# Patient Record
Sex: Female | Born: 1975
Health system: Southern US, Community
[De-identification: ages and names within clinical notes are randomized; demographics above are authoritative.]

## PROBLEM LIST (undated history)

## (undated) HISTORY — PX: INTRAUTERINE DEVICE (IUD) INSERTION: SHX5877

## (undated) HISTORY — PX: TONSILLECTOMY AND ADENOIDECTOMY: SHX28

---

## 2003-09-14 ENCOUNTER — Other Ambulatory Visit: Admission: RE | Admit: 2003-09-14 | Discharge: 2003-09-14 | Payer: Self-pay | Admitting: Obstetrics and Gynecology

## 2004-01-05 ENCOUNTER — Other Ambulatory Visit: Admission: RE | Admit: 2004-01-05 | Discharge: 2004-01-05 | Payer: Self-pay | Admitting: Obstetrics and Gynecology

## 2005-07-02 ENCOUNTER — Inpatient Hospital Stay (HOSPITAL_COMMUNITY): Admission: AD | Admit: 2005-07-02 | Discharge: 2005-07-02 | Payer: Self-pay | Admitting: Obstetrics and Gynecology

## 2006-02-02 ENCOUNTER — Inpatient Hospital Stay (HOSPITAL_COMMUNITY): Admission: AD | Admit: 2006-02-02 | Discharge: 2006-02-05 | Payer: Self-pay | Admitting: Obstetrics & Gynecology

## 2006-02-13 ENCOUNTER — Ambulatory Visit: Admission: RE | Admit: 2006-02-13 | Discharge: 2006-02-13 | Payer: Self-pay | Admitting: Obstetrics and Gynecology

## 2009-07-09 ENCOUNTER — Encounter (INDEPENDENT_AMBULATORY_CARE_PROVIDER_SITE_OTHER): Payer: Self-pay | Admitting: Obstetrics

## 2009-07-09 ENCOUNTER — Inpatient Hospital Stay (HOSPITAL_COMMUNITY): Admission: AD | Admit: 2009-07-09 | Discharge: 2009-07-09 | Payer: Self-pay | Admitting: Obstetrics

## 2010-09-17 ENCOUNTER — Other Ambulatory Visit: Payer: Self-pay | Admitting: Obstetrics & Gynecology

## 2010-09-18 ENCOUNTER — Inpatient Hospital Stay (HOSPITAL_COMMUNITY): Admission: AD | Admit: 2010-09-18 | Discharge: 2010-09-22 | Payer: Self-pay | Admitting: Obstetrics & Gynecology

## 2011-01-22 LAB — CBC
HCT: 33.8 % — ABNORMAL LOW (ref 36.0–46.0)
HCT: 37.9 % (ref 36.0–46.0)
Hemoglobin: 10.5 g/dL — ABNORMAL LOW (ref 12.0–15.0)
Hemoglobin: 11.6 g/dL — ABNORMAL LOW (ref 12.0–15.0)
MCH: 34.1 pg — ABNORMAL HIGH (ref 26.0–34.0)
MCHC: 34.3 g/dL (ref 30.0–36.0)
MCV: 100.4 fL — ABNORMAL HIGH (ref 78.0–100.0)
MCV: 99.4 fL (ref 78.0–100.0)
Platelets: 195 10*3/uL (ref 150–400)
Platelets: 215 10*3/uL (ref 150–400)
RBC: 3.36 MIL/uL — ABNORMAL LOW (ref 3.87–5.11)
RBC: 3.75 MIL/uL — ABNORMAL LOW (ref 3.87–5.11)
RBC: 3.81 MIL/uL — ABNORMAL LOW (ref 3.87–5.11)
RDW: 14.4 % (ref 11.5–15.5)
RDW: 14.5 % (ref 11.5–15.5)
RDW: 14.8 % (ref 11.5–15.5)
WBC: 12.7 10*3/uL — ABNORMAL HIGH (ref 4.0–10.5)

## 2011-01-22 LAB — SURGICAL PCR SCREEN: MRSA, PCR: NEGATIVE

## 2011-03-29 NOTE — Consult Note (Signed)
Katelyn [name], Katelyn [name]              ACCOUNT NO.:  [id number]   MEDICAL RECORD NO.:  [id number]          PATIENT TYPE:  MAT   LOCATION:  [facility]                          FACILITY:  [facility]   PHYSICIAN:  Lenoard Aden, M.D.DATE OF BIRTH:  11-22-1975   DATE OF CONSULTATION:  DATE OF DISCHARGE:                                   CONSULTATION   CONSULTING PHYSICIAN:  Lenoard Aden, M.D.   CHIEF COMPLAINT:  First trimester bleeding.   HISTORY OF PRESENT ILLNESS:  The patient is a 35 year old white female, G2,  P0, at 8 weeks' gestation, with bright red blood per vagina tonight.  She  has a history of spontaneous pregnancy loss x1.   MEDICATIONS:  Prenatal vitamins.   No known drug allergies.   Pregnancy history otherwise noncontributory.   SOCIAL HISTORY:  Noncontributory.   FAMILY HISTORY:  Noncontributory.   PHYSICAL EXAMINATION:  GENERAL:  She is a well-developed, well-nourished,  white female in no acute distress.  HEENT:  Normal.  LUNGS:  Clear.  HEART:  Regular rhythm.  ABDOMEN:  Soft, nontender.  PELVIC:  Reveals an 8-week size uterus.  No adnexal masses.  Minimal  bleeding noted.  EXTREMITIES:  Reveal no cords.  NEUROLOGIC:  Nonfocal.   Ultrasound reveals a 7.5-week viable intrauterine pregnancy.  Fetal heart  tones noted.  Small hemorrhagic clot noted over the cervix.   IMPRESSION:  Threatened abortion with viable intrauterine pregnancy.   PLAN:  1.  Reassurance given.  2.  Discharge home.  3.  Bleeding precautions given.  4.  Follow up in the office in one week.      Lenoard Aden, M.D.  Electronically Signed     RJT/MEDQ  D:  07/02/2005  T:  07/02/2005  Job:  [id number]   cc:   Ma Hillock

## 2011-03-29 NOTE — Discharge Summary (Signed)
[name], [name]              ACCOUNT NO.:  [id number]   MEDICAL RECORD NO.:  [id number]          PATIENT TYPE:  INP   LOCATION:  9105                          FACILITY:  [facility]   PHYSICIAN:  Genia Del, M.D.DATE OF BIRTH:  [birth year]   DATE OF ADMISSION:  02/02/2006  DATE OF DISCHARGE:  02/05/2006                                 DISCHARGE SUMMARY   ADMISSION DIAGNOSES:  1.  Thirty-eight-weeks and 6 days' gestation.  2.  Spontaneous labor.  3.  Breech presentation.   DISCHARGE DIAGNOSES:  1.  Thirty-eight-weeks and 6 days' gestation.  2.  Spontaneous labor.  3.  Breech presentation.  4.  Birth of a baby boy by cesarean section.   PROCEDURE:  Urgent low transverse primary cesarean section on February 02, 2006.   HOSPITAL COURSE:  The patient had an uneventful C-section on February 02, 2006.  She had a baby boy with Apgar scores at 8 and 9.  Estimated blood loss was  600 mL.  Her hemoglobin on postop day 1 was 12.4.  She was discharged on  postop day 3 in stable status.  Postop advise was given.  The patient was  discharged home.   FOLLOWUP:  She will follow up in 4 weeks at [facility].   DISCHARGE MEDICATIONS:  She was prescribed Vicodin p.r.n.      Genia Del, M.D.  Electronically Signed     ML/MEDQ  D:  03/02/2006  T:  03/04/2006  Job:  [id number]

## 2011-03-29 NOTE — Op Note (Signed)
Katelyn [name], Katelyn [name]              ACCOUNT NO.:  [id number]   MEDICAL RECORD NO.:  [id number]          PATIENT TYPE:  INP   LOCATION:  9105                          [facility]ILITY:  [facility]   PHYSICIAN:  Genia Del, M.D.DATE OF BIRTH:  20-Dec-1975   DATE OF PROCEDURE:  DATE OF DISCHARGE:                                 OPERATIVE REPORT   PREOPERATIVE DIAGNOSES:  1.  Thirty-eight weeks 6 days' gestation.  2.  Breech presentation with spontaneous labor.   POSTOPERATIVE DIAGNOSES:  1.  Thirty-eight weeks 6 days' gestation.  2.  Breech presentation with spontaneous labor.   INTERVENTION:  Urgent low transverse primary cesarean section.   SURGEON:  Genia Del, M.D.  No assistant.   ANESTHESIOLOGIST:  Quillian Quince, M.D.   PROCEDURE:  Under spinal anesthesia, the patient is in 15 degree lateral  decubitus position.  She is prepped with Betadine on the abdominal,  suprapubic, vulvar and vaginal areas and draped as usual.  The bladder is  catheterized.  A Pfannenstiel incision is made with a scalpel.  The adipose  tissue is opened with a scalpel and electrocautery.  We open the aponeurosis  transversely with Mayo scissors.  The recti abdominal muscles are separated  from the aponeurosis on the midline.  We open the parietal peritoneum  longitudinally with Metzenbaum scissors.  The bladder retractor is put in  place and the visceral peritoneum over the lower uterine segment is opened  transversely with Metzenbaum scissors.  The bladder is reclined downward.  We then proceed with a low transverse hysterotomy with a scalpel, extension  on each side with dressing scissors.  The amniotic fluid is clear.  The  fetus is in frank breech presentation.  Birth of a baby boy at 2225.  The  baby is suctioned, the cord is clamped and cut.  The Apgars are 8 and 9.  The placenta is evacuated spontaneously.  It is complete with three vessels.  The intrauterine revision is done.  The uterus  appears normal in appearance.  The two ovaries and the two tubes are normal in appearance as well.  The  uterus contracts well.  Pitocin IV was started.  A dose of Ancef 1 g IV was  given.  The hysterotomy was closed in one full locked running suture of 0  Vicryl.  A second plane in a mattress fashion is done with 0 Vicryl.  Hemostasis is completed in the midline of the incision with two X stitches  of 0 Vicryl.  Hemostasis is then adequate.  We irrigate and suction the  abdominal-pelvic cavity.  We then complete hemostasis on the recti muscles  and the adipose tissue with the electrocautery.  We close the aponeurosis  with two half running sutures of 0 Vicryl.  The skin is reapproximated with  staples and infiltration of Marcaine 0.25% 10 mL is done at the subcutaneous  tissue  along the incision.  A dry dressing is applied.  The count of instruments  and sponges was complete x2.  The estimated blood loss was 600 mL.  No  complication occurred, and the patient  was transferred to the recovery room  in good status.      Genia Del, M.D.  Electronically Signed     ML/MEDQ  D:  02/02/2006  T:  02/04/2006  Job:  [id number]

## 2011-06-06 ENCOUNTER — Other Ambulatory Visit: Payer: Self-pay | Admitting: Obstetrics & Gynecology

## 2011-06-06 DIAGNOSIS — N644 Mastodynia: Secondary | ICD-10-CM

## 2011-06-07 ENCOUNTER — Ambulatory Visit
Admission: RE | Admit: 2011-06-07 | Discharge: 2011-06-07 | Disposition: A | Discharge status: Home / Self Care | Payer: BC Managed Care – PPO | Source: Ambulatory Visit | Attending: Obstetrics & Gynecology | Admitting: Obstetrics & Gynecology

## 2011-06-07 DIAGNOSIS — N644 Mastodynia: Secondary | ICD-10-CM

## 2011-12-23 ENCOUNTER — Other Ambulatory Visit: Payer: Self-pay | Admitting: Obstetrics & Gynecology

## 2011-12-23 DIAGNOSIS — N611 Abscess of the breast and nipple: Secondary | ICD-10-CM

## 2011-12-24 ENCOUNTER — Ambulatory Visit
Admission: RE | Admit: 2011-12-24 | Discharge: 2011-12-24 | Disposition: A | Discharge status: Home / Self Care | Payer: BC Managed Care – PPO | Source: Ambulatory Visit | Attending: Obstetrics & Gynecology | Admitting: Obstetrics & Gynecology

## 2011-12-24 DIAGNOSIS — N611 Abscess of the breast and nipple: Secondary | ICD-10-CM

## 2014-09-15 ENCOUNTER — Encounter: Payer: Self-pay | Admitting: Podiatry

## 2014-09-15 ENCOUNTER — Ambulatory Visit (INDEPENDENT_AMBULATORY_CARE_PROVIDER_SITE_OTHER): Payer: BC Managed Care – PPO | Admitting: Podiatry

## 2014-09-15 ENCOUNTER — Ambulatory Visit (INDEPENDENT_AMBULATORY_CARE_PROVIDER_SITE_OTHER): Payer: BC Managed Care – PPO

## 2014-09-15 VITALS — BP 92/58 | HR 72 | Resp 16

## 2014-09-15 DIAGNOSIS — M2010 Hallux valgus (acquired), unspecified foot: Secondary | ICD-10-CM

## 2014-09-15 DIAGNOSIS — M79673 Pain in unspecified foot: Secondary | ICD-10-CM

## 2014-09-15 DIAGNOSIS — M779 Enthesopathy, unspecified: Secondary | ICD-10-CM

## 2014-09-15 DIAGNOSIS — M7750 Other enthesopathy of unspecified foot: Secondary | ICD-10-CM

## 2014-09-15 NOTE — Progress Notes (Signed)
Subjective:     Patient ID: Katelyn [name], female   DOB: [birth year], 38 y.o.   MRN: [id number]  HPIpatient states I have had problems with the bunions on both my feet becoming painful and I've tried to change shoe gear and pad without relief and also I do get pain in my feet in general if I do a lot of walking   Review of Systems  All other systems reviewed and are negative.      Objective:   Physical Exam  Constitutional: She is oriented to person, place, and time.  Cardiovascular: Intact distal pulses.   Musculoskeletal: Normal range of motion.  Neurological: She is oriented to person, place, and time.  Skin: Skin is warm.  Nursing note and vitals reviewed. neurovascular status intact with muscle strength adequate and range of motion of the subtalar and midtarsal joint within normal limits. Patient is noted to have mild hyperostosis medial aspect first metatarsal head left and right that are painful when pressed with redness around the first metatarsal head and is also noted to have moderate depression of the arch with inflammation noted. Digits are noted to be well perfused patient well oriented 3 and has no equinus condition     Assessment:     Structural HAV deformity bilateral along with mild tendinitis-like symptoms secondary to foot structure with significant family history of structural bunion deformity    Plan:     H&P and x-rays reviewed with patient. Today we discussed correction of deformity due to her long-standing nature and failure to respond to conservative care and she would like to have surgery done but will most likely wait until February as she is going to [employer] the end of January. She will call with date and we will have her back prior to go over everything in detail and today I did go ahead and I scanned for custom orthotics to reduce plantar stress on her feet

## 2014-09-15 NOTE — Patient Instructions (Signed)

## 2014-09-15 NOTE — Progress Notes (Signed)
   Subjective:    Patient ID: Katelyn [name], female    DOB: 05/21/1976, 38 y.o.   MRN: [id number]  HPI Comments: "I have these knots on the side"  Patient c/o aching 1st MPJ bilateral for about 1 year. The area is very painful with shoes and walking a lot. No home treatment.  Foot Pain Associated symptoms include arthralgias.      Review of Systems  Musculoskeletal: Positive for arthralgias.  All other systems reviewed and are negative.      Objective:   Physical Exam        Assessment & Plan:

## 2014-10-14 ENCOUNTER — Ambulatory Visit: Payer: BC Managed Care – PPO

## 2014-10-14 DIAGNOSIS — M779 Enthesopathy, unspecified: Secondary | ICD-10-CM

## 2014-10-14 NOTE — Progress Notes (Signed)
PT is here to PUO

## 2014-10-14 NOTE — Patient Instructions (Signed)

## 2017-05-08 DIAGNOSIS — D225 Melanocytic nevi of trunk: Secondary | ICD-10-CM | POA: Diagnosis not present

## 2017-05-08 DIAGNOSIS — D1801 Hemangioma of skin and subcutaneous tissue: Secondary | ICD-10-CM | POA: Diagnosis not present

## 2017-05-08 DIAGNOSIS — L814 Other melanin hyperpigmentation: Secondary | ICD-10-CM | POA: Diagnosis not present

## 2018-06-29 ENCOUNTER — Ambulatory Visit (INDEPENDENT_AMBULATORY_CARE_PROVIDER_SITE_OTHER): Payer: 59 | Admitting: Obstetrics & Gynecology

## 2018-06-29 ENCOUNTER — Encounter: Payer: Self-pay | Admitting: Obstetrics & Gynecology

## 2018-06-29 VITALS — BP 118/76

## 2018-06-29 DIAGNOSIS — Z30431 Encounter for routine checking of intrauterine contraceptive device: Secondary | ICD-10-CM

## 2018-06-29 DIAGNOSIS — Z01419 Encounter for gynecological examination (general) (routine) without abnormal findings: Secondary | ICD-10-CM

## 2018-06-29 NOTE — Progress Notes (Signed)
Katelyn [name] Oct 06, 1976 [id number]   History:    42 y.o. [id number] Married.  Son is in 7th grade, daughter in 2nd grade.  RP:  Established patient presenting for annual gyn exam   HPI: Well on Mirena IUD x 11/2015.  Rare light periods.  No pelvic pain.  No pain with IC.  Urine/BMs wnl.  Breasts wnl.  Normal weight/height.  Good nutrition.  Active physically.  Will schedule Health labs with Fam MD.  Past medical history,surgical history, family history and social history were all reviewed and documented in the EPIC chart.  Gynecologic History No LMP recorded. (Menstrual status: IUD). Contraception: Mirena IUD x 11/2015 Last Pap: 2016. Results were: normal Last mammogram: 11/2015. Results were: Negative. Bone Density: Never Colonoscopy: Never  Obstetric History OB History  Gravida Para Term Preterm AB Living  4 2     2 2   SAB TAB Ectopic Multiple Live Births  2            # Outcome Date GA Lbr Len/2nd Weight Sex Delivery Anes PTL Lv  4 SAB           3 SAB           2 Para           1 Para              ROS: A ROS was performed and pertinent positives and negatives are included in the history.  GENERAL: No fevers or chills. HEENT: No change in vision, no earache, sore throat or sinus congestion. NECK: No pain or stiffness. CARDIOVASCULAR: No chest pain or pressure. No palpitations. PULMONARY: No shortness of breath, cough or wheeze. GASTROINTESTINAL: No abdominal pain, nausea, vomiting or diarrhea, melena or bright red blood per rectum. GENITOURINARY: No urinary frequency, urgency, hesitancy or dysuria. MUSCULOSKELETAL: No joint or muscle pain, no back pain, no recent trauma. DERMATOLOGIC: No rash, no itching, no lesions. ENDOCRINE: No polyuria, polydipsia, no heat or cold intolerance. No recent change in weight. HEMATOLOGICAL: No anemia or easy bruising or bleeding. NEUROLOGIC: No headache, seizures, numbness, tingling or weakness. PSYCHIATRIC: No depression, no loss of interest  in normal activity or change in sleep pattern.     Exam:   BP 118/76   There is no height or weight on file to calculate BMI.  General appearance : Well developed well nourished female. No acute distress HEENT: Eyes: no retinal hemorrhage or exudates,  Neck supple, trachea midline, no carotid bruits, no thyroidmegaly Lungs: Clear to auscultation, no rhonchi or wheezes, or rib retractions  Heart: Regular rate and rhythm, no murmurs or gallops Breast:Examined in sitting and supine position were symmetrical in appearance, no palpable masses or tenderness,  no skin retraction, no nipple inversion, no nipple discharge, no skin discoloration, no axillary or supraclavicular lymphadenopathy Abdomen: no palpable masses or tenderness, no rebound or guarding Extremities: no edema or skin discoloration or tenderness  Pelvic: Vulva: Normal             Vagina: No gross lesions or discharge  Cervix: No gross lesions or discharge.  Pap/HPV HR done.  IUD strings visible.  Uterus  AV, normal size, shape and consistency, non-tender and mobile  Adnexa  Without masses or tenderness  Anus: Normal   Assessment/Plan:  42 y.o. female for annual exam   1. Encounter for routine gynecological examination with Papanicolaou smear of cervix Normal gynecologic exam.  Pap with HPV high risk done today.  Breast exam normal.  Will schedule screening mammogram now.  Health labs with family physician.  Continue with regular physical activity and healthy nutrition.  2. Encounter for routine checking of intrauterine contraceptive device (IUD) Mirena IUD well-tolerated and in good position.  Inserted in January 2017.  Princess Bruins MD, 3:27 PM 06/29/2018

## 2018-06-30 ENCOUNTER — Other Ambulatory Visit: Payer: Self-pay | Admitting: Obstetrics & Gynecology

## 2018-06-30 DIAGNOSIS — Z1231 Encounter for screening mammogram for malignant neoplasm of breast: Secondary | ICD-10-CM

## 2018-06-30 LAB — PAP IG W/ RFLX HPV ASCU

## 2018-07-05 ENCOUNTER — Encounter: Payer: Self-pay | Admitting: Obstetrics & Gynecology

## 2018-07-05 NOTE — Patient Instructions (Signed)
1. Encounter for routine gynecological examination with Papanicolaou smear of cervix Normal gynecologic exam.  Pap with HPV high risk done today.  Breast exam normal.  Will schedule screening mammogram now.  Health labs with family physician.  Continue with regular physical activity and healthy nutrition.  2. Encounter for routine checking of intrauterine contraceptive device (IUD) Mirena IUD well-tolerated and in good position.  Inserted in January 2017.  Lanetta, it is always a pleasure to see you!  I will inform you of your results as soon as they are available.

## 2018-07-27 ENCOUNTER — Ambulatory Visit: Payer: 59

## 2018-08-20 ENCOUNTER — Ambulatory Visit
Admission: RE | Admit: 2018-08-20 | Discharge: 2018-08-20 | Disposition: A | Discharge status: Home / Self Care | Payer: 59 | Source: Ambulatory Visit | Attending: Obstetrics & Gynecology | Admitting: Obstetrics & Gynecology

## 2018-08-20 DIAGNOSIS — Z1231 Encounter for screening mammogram for malignant neoplasm of breast: Secondary | ICD-10-CM | POA: Diagnosis not present

## 2018-12-01 DIAGNOSIS — Z0000 Encounter for general adult medical examination without abnormal findings: Secondary | ICD-10-CM | POA: Diagnosis not present

## 2018-12-01 DIAGNOSIS — Z136 Encounter for screening for cardiovascular disorders: Secondary | ICD-10-CM | POA: Diagnosis not present

## 2019-02-22 NOTE — Telephone Encounter (Signed)
Patient needs OV with with ML due to breast problem

## 2019-02-23 ENCOUNTER — Encounter: Payer: Self-pay | Admitting: Obstetrics & Gynecology

## 2019-02-23 ENCOUNTER — Ambulatory Visit (INDEPENDENT_AMBULATORY_CARE_PROVIDER_SITE_OTHER): Payer: 59 | Admitting: Obstetrics & Gynecology

## 2019-02-23 ENCOUNTER — Other Ambulatory Visit: Payer: Self-pay

## 2019-02-23 ENCOUNTER — Telehealth: Payer: Self-pay | Admitting: *Deleted

## 2019-02-23 VITALS — BP 106/68

## 2019-02-23 DIAGNOSIS — N6310 Unspecified lump in the right breast, unspecified quadrant: Secondary | ICD-10-CM

## 2019-02-23 NOTE — Telephone Encounter (Signed)
Appointment scheduled for today at 10:30. Emailed patient appointment information.

## 2019-02-23 NOTE — Patient Instructions (Signed)
1. Large mass of right breast Large right breast mass measuring 3 x 4 cm at 10:00 noticed 2 days ago.  The mass feels solid, distinct, mobile, mildly tender.  No first-degree relative with breast cancer.  Last screening mammogram October 2019 was negative.  Will schedule a right diagnostic mammogram and ultrasound at the breast center now.  Mitali, it was a pleasure seeing you today!

## 2019-02-23 NOTE — Telephone Encounter (Signed)
Patient scheduled on 02/25/19 @ 1:00pm at breast center, patient informed.

## 2019-02-23 NOTE — Telephone Encounter (Signed)
-----   Message from Princess Bruins, MD sent at 02/23/2019 11:28 AM EDT ----- Regarding: Schedule Rt Dx Mammo/US at the [facility] breast mass noticed 2 days ago, on exam Rt Breast mass at 10 O'Clock, close to areola, 3 x 4 cm, feels solid, mobile, mildly tender.  No first degree relative with breast Ca.  Screening mammo negative 08/2018.

## 2019-02-23 NOTE — Progress Notes (Signed)
    Katelyn [name] [facility] 1976/05/10 [id number]        43 y.o.  [id number] Married.  Children Son 7th grade, daughter 2nd grade.  RP: Rt breast mass noticed x 2 days  HPI: Patient noticed a right breast mass Sunday night as she was laying in bed, 2 days ago.  It felt tender at that time and since then feels some pressure pain and tenderness at that level.  No change in skin.  No nipple discharge.  No first-degree relative with breast cancer.  Last screening mammogram October 2019 was negative.  Patient is doing well with the Mirena IUD x 11/2015.     OB History  Gravida Para Term Preterm AB Living  4 2     2 2   SAB TAB Ectopic Multiple Live Births  2            # Outcome Date GA Lbr Len/2nd Weight Sex Delivery Anes PTL Lv  4 SAB           3 SAB           2 Para           1 Para             Past medical history,surgical history, problem list, medications, allergies, family history and social history were all reviewed and documented in the EPIC chart.   Directed ROS with pertinent positives and negatives documented in the history of present illness/assessment and plan.  Exam:  Vitals:   02/23/19 1100  BP: 106/68   General appearance:  Normal  Breast exam:  Left breast normal, no axillary LN felt.  Right breast, mass as described below, appears solid, well circumscribed, mobile, mildly tender.  No skin change, no nipple d/c.  No Rt axillary LN felt.  Physical Exam Chest:        Assessment/Plan:  43 y.o. [id number]   1. Large mass of right breast Large right breast mass measuring 3 x 4 cm at 10:00 noticed 2 days ago.  The mass feels solid, distinct, mobile, mildly tender.  No first-degree relative with breast cancer.  Last screening mammogram October 2019 was negative.  Will schedule a right diagnostic mammogram and ultrasound at the breast center now.  Counseling on above issues and coordination of care more than 50% for 15 minutes.  Princess Bruins MD, 11:18 AM  02/23/2019

## 2019-02-25 ENCOUNTER — Ambulatory Visit
Admission: RE | Admit: 2019-02-25 | Discharge: 2019-02-25 | Disposition: A | Discharge status: Home / Self Care | Payer: 59 | Source: Ambulatory Visit | Attending: Obstetrics & Gynecology | Admitting: Obstetrics & Gynecology

## 2019-02-25 ENCOUNTER — Other Ambulatory Visit: Payer: Self-pay

## 2019-02-25 DIAGNOSIS — N6310 Unspecified lump in the right breast, unspecified quadrant: Secondary | ICD-10-CM | POA: Diagnosis not present

## 2019-06-30 ENCOUNTER — Other Ambulatory Visit: Payer: Self-pay

## 2019-07-01 ENCOUNTER — Ambulatory Visit (INDEPENDENT_AMBULATORY_CARE_PROVIDER_SITE_OTHER): Payer: 59 | Admitting: Obstetrics & Gynecology

## 2019-07-01 ENCOUNTER — Encounter: Payer: Self-pay | Admitting: Obstetrics & Gynecology

## 2019-07-01 VITALS — BP 110/68 | Ht 66.5 in | Wt 137.0 lb

## 2019-07-01 DIAGNOSIS — Z01419 Encounter for gynecological examination (general) (routine) without abnormal findings: Secondary | ICD-10-CM | POA: Diagnosis not present

## 2019-07-01 DIAGNOSIS — Z30431 Encounter for routine checking of intrauterine contraceptive device: Secondary | ICD-10-CM

## 2019-07-01 NOTE — Progress Notes (Signed)
Katelyn [name] [facility] 01/19/1976 [id number]   History:    43 y.o. [id number] Married.  Son is in 8th grade, daughter in 3nd grade.  RP:  Established patient presenting for annual gyn exam   HPI: Well on Mirena IUD x 11/2015.  Rare light periods.  No pelvic pain.  No pain with IC.  Urine/BMs wnl.  Breasts wnl.  BMI 21.78.  Good nutrition.  Active physically.  Will schedule Health labs with Fam MD.  Past medical history,surgical history, family history and social history were all reviewed and documented in the EPIC chart.  Gynecologic History No LMP recorded. (Menstrual status: IUD). Contraception: Mirena IUD x 11/2015 Last Pap: 06/2018. Results were: Negative Last mammogram: 08/2018. Results were: Negative.  Rt Dx mammo Benign (Benign cyst) Bone Density: Never Colonoscopy: Never  Obstetric History OB History  Gravida Para Term Preterm AB Living  4 2     2 2   SAB TAB Ectopic Multiple Live Births  2            # Outcome Date GA Lbr Len/2nd Weight Sex Delivery Anes PTL Lv  4 SAB           3 SAB           2 Para           1 Para              ROS: A ROS was performed and pertinent positives and negatives are included in the history.  GENERAL: No fevers or chills. HEENT: No change in vision, no earache, sore throat or sinus congestion. NECK: No pain or stiffness. CARDIOVASCULAR: No chest pain or pressure. No palpitations. PULMONARY: No shortness of breath, cough or wheeze. GASTROINTESTINAL: No abdominal pain, nausea, vomiting or diarrhea, melena or bright red blood per rectum. GENITOURINARY: No urinary frequency, urgency, hesitancy or dysuria. MUSCULOSKELETAL: No joint or muscle pain, no back pain, no recent trauma. DERMATOLOGIC: No rash, no itching, no lesions. ENDOCRINE: No polyuria, polydipsia, no heat or cold intolerance. No recent change in weight. HEMATOLOGICAL: No anemia or easy bruising or bleeding. NEUROLOGIC: No headache, seizures, numbness, tingling or weakness. PSYCHIATRIC:  No depression, no loss of interest in normal activity or change in sleep pattern.     Exam:   BP 110/68    Ht 5' 6.5" (1.689 m)    Wt 137 lb (62.1 kg)    BMI 21.78 kg/m   Body mass index is 21.78 kg/m.  General appearance : Well developed well nourished female. No acute distress HEENT: Eyes: no retinal hemorrhage or exudates,  Neck supple, trachea midline, no carotid bruits, no thyroidmegaly Lungs: Clear to auscultation, no rhonchi or wheezes, or rib retractions  Heart: Regular rate and rhythm, no murmurs or gallops Breast:Examined in sitting and supine position were symmetrical in appearance, no palpable masses or tenderness,  no skin retraction, no nipple inversion, no nipple discharge, no skin discoloration, no axillary or supraclavicular lymphadenopathy Abdomen: no palpable masses or tenderness, no rebound or guarding Extremities: no edema or skin discoloration or tenderness  Pelvic: Vulva: Normal             Vagina: No gross lesions or discharge  Cervix: No gross lesions or discharge.  IUD strings felt at [facility]  Uterus  AV, normal size, shape and consistency, non-tender and mobile  Adnexa  Without masses or tenderness  Anus: Normal   Assessment/Plan:  43 y.o. female for annual exam   1. Well female exam with  routine gynecological exam Normal gynecologic exam.  Pap test August 2019 was negative, no indication to repeat this year.  Breast exam normal.  Last mammogram October 2019 was negative.  A right diagnostic mammogram was benign in April 2020.  Good body mass index at 21.78.  Continue with fitness and healthy nutrition.  Will do health labs with family physician.  2. Encounter for routine checking of intrauterine contraceptive device (IUD) Mirena IUD since January 2017.  Well-tolerated and in good position.  Princess Bruins MD, 3:37 PM 07/01/2019

## 2019-07-05 ENCOUNTER — Encounter: Payer: Self-pay | Admitting: Obstetrics & Gynecology

## 2019-07-05 NOTE — Patient Instructions (Signed)
1. Well female exam with routine gynecological exam Normal gynecologic exam.  Pap test August 2019 was negative, no indication to repeat this year.  Breast exam normal.  Last mammogram October 2019 was negative.  A right diagnostic mammogram was benign in April 2020.  Good body mass index at 21.78.  Continue with fitness and healthy nutrition.  Will do health labs with family physician.  2. Encounter for routine checking of intrauterine contraceptive device (IUD) Mirena IUD since January 2017.  Well-tolerated and in good position.  Katelyn [name], it was a pleasure seeing you today!

## 2019-07-14 ENCOUNTER — Other Ambulatory Visit: Payer: Self-pay | Admitting: Obstetrics & Gynecology

## 2019-07-14 DIAGNOSIS — Z1231 Encounter for screening mammogram for malignant neoplasm of breast: Secondary | ICD-10-CM

## 2019-11-16 ENCOUNTER — Other Ambulatory Visit: Payer: Self-pay

## 2019-11-16 ENCOUNTER — Ambulatory Visit
Admission: RE | Admit: 2019-11-16 | Discharge: 2019-11-16 | Disposition: A | Discharge status: Home / Self Care | Payer: 59 | Source: Ambulatory Visit | Attending: Obstetrics & Gynecology | Admitting: Obstetrics & Gynecology

## 2019-11-16 DIAGNOSIS — Z1231 Encounter for screening mammogram for malignant neoplasm of breast: Secondary | ICD-10-CM

## 2020-07-03 ENCOUNTER — Encounter: Payer: Self-pay | Admitting: Obstetrics & Gynecology

## 2020-07-03 ENCOUNTER — Ambulatory Visit (INDEPENDENT_AMBULATORY_CARE_PROVIDER_SITE_OTHER): Payer: 59 | Admitting: Obstetrics & Gynecology

## 2020-07-03 ENCOUNTER — Other Ambulatory Visit: Payer: Self-pay

## 2020-07-03 VITALS — BP 120/82 | Ht 66.0 in | Wt 141.0 lb

## 2020-07-03 DIAGNOSIS — Z30431 Encounter for routine checking of intrauterine contraceptive device: Secondary | ICD-10-CM | POA: Diagnosis not present

## 2020-07-03 DIAGNOSIS — Z01419 Encounter for gynecological examination (general) (routine) without abnormal findings: Secondary | ICD-10-CM

## 2020-07-03 NOTE — Progress Notes (Signed)
Katelyn [name] [facility] [birth year] [id number]   History:    44 y.o.  [id number] Married. Son is in 9th grade, daughter in 4 th grade.  PZ:[id number] presenting for annual gyn exam   ERX:VQMG on Mirena IUD x 11/2015. Rare light periods. No pelvic pain. No pain with IC. Urine/BMs wnl. Breasts wnl. BMI 22.76. Good nutrition. Active physically. Will schedule Health labs with Fam MD.   Past medical history,surgical history, family history and social history were all reviewed and documented in the EPIC chart.  Gynecologic History No LMP recorded. (Menstrual status: IUD).  Obstetric History OB History  Gravida Para Term Preterm AB Living  4 2     2 2   SAB TAB Ectopic Multiple Live Births  2            # Outcome Date GA Lbr Len/2nd Weight Sex Delivery Anes PTL Lv  4 SAB           3 SAB           2 Para           1 Para              ROS: A ROS was performed and pertinent positives and negatives are included in the history.  GENERAL: No fevers or chills. HEENT: No change in vision, no earache, sore throat or sinus congestion. NECK: No pain or stiffness. CARDIOVASCULAR: No chest pain or pressure. No palpitations. PULMONARY: No shortness of breath, cough or wheeze. GASTROINTESTINAL: No abdominal pain, nausea, vomiting or diarrhea, melena or bright red blood per rectum. GENITOURINARY: No urinary frequency, urgency, hesitancy or dysuria. MUSCULOSKELETAL: No joint or muscle pain, no back pain, no recent trauma. DERMATOLOGIC: No rash, no itching, no lesions. ENDOCRINE: No polyuria, polydipsia, no heat or cold intolerance. No recent change in weight. HEMATOLOGICAL: No anemia or easy bruising or bleeding. NEUROLOGIC: No headache, seizures, numbness, tingling or weakness. PSYCHIATRIC: No depression, no loss of interest in normal activity or change in sleep pattern.     Exam:   BP 120/82 (BP Location: Right Arm, Patient Position: Sitting, Cuff Size: Normal)    Ht 5\' 6"  (1.676  m)    Wt 141 lb (64 kg)    BMI 22.76 kg/m   Body mass index is 22.76 kg/m.  General appearance : Well developed well nourished female. No acute distress HEENT: Eyes: no retinal hemorrhage or exudates,  Neck supple, trachea midline, no carotid bruits, no thyroidmegaly Lungs: Clear to auscultation, no rhonchi or wheezes, or rib retractions  Heart: Regular rate and rhythm, no murmurs or gallops Breast:Examined in sitting and supine position were symmetrical in appearance, no palpable masses or tenderness,  no skin retraction, no nipple inversion, no nipple discharge, no skin discoloration, no axillary or supraclavicular lymphadenopathy Abdomen: no palpable masses or tenderness, no rebound or guarding Extremities: no edema or skin discoloration or tenderness  Pelvic: Vulva: Normal             Vagina: No gross lesions or discharge  Cervix: No gross lesions or discharge.  IUD strings visible at the EO.  Pap reflex done.  Uterus  AV, normal size, shape and consistency, non-tender and mobile  Adnexa  Without masses or tenderness  Anus: Normal   Assessment/Plan:  44 y.o. female for annual exam   1. Encounter for routine gynecological examination with Papanicolaou smear of cervix Normal gynecologic exam.  Pap reflex done.  Breast exam normal.  Screening mammogram January 2021 was negative.  Good body mass index at 26.76.  Continue with fitness and healthy nutrition.  Health labs with family physician.  2. Encounter for routine checking of intrauterine contraceptive device (IUD) Well on the Mirena IUD.  IUD in good position.  Inserted January 2017, will come back January 2022 for removal and insertion of a new Mirena IUD.  Princess Bruins MD, 2:19 PM 07/03/2020

## 2020-07-04 LAB — PAP IG W/ RFLX HPV ASCU

## 2020-11-08 ENCOUNTER — Other Ambulatory Visit: Payer: Self-pay | Admitting: Obstetrics & Gynecology

## 2020-11-08 DIAGNOSIS — Z1231 Encounter for screening mammogram for malignant neoplasm of breast: Secondary | ICD-10-CM

## 2020-11-17 ENCOUNTER — Other Ambulatory Visit: Payer: Self-pay

## 2020-11-17 ENCOUNTER — Ambulatory Visit
Admission: RE | Admit: 2020-11-17 | Discharge: 2020-11-17 | Disposition: A | Discharge status: Home / Self Care | Payer: 59 | Source: Ambulatory Visit

## 2020-11-17 DIAGNOSIS — Z1231 Encounter for screening mammogram for malignant neoplasm of breast: Secondary | ICD-10-CM

## 2020-11-20 ENCOUNTER — Other Ambulatory Visit: Payer: Self-pay | Admitting: Anesthesiology

## 2020-11-20 DIAGNOSIS — Z30433 Encounter for removal and reinsertion of intrauterine contraceptive device: Secondary | ICD-10-CM

## 2020-11-22 ENCOUNTER — Other Ambulatory Visit: Payer: Self-pay

## 2020-11-22 ENCOUNTER — Encounter: Payer: Self-pay | Admitting: Obstetrics & Gynecology

## 2020-11-22 ENCOUNTER — Ambulatory Visit (INDEPENDENT_AMBULATORY_CARE_PROVIDER_SITE_OTHER): Payer: 59 | Admitting: Obstetrics & Gynecology

## 2020-11-22 VITALS — BP 118/76

## 2020-11-22 DIAGNOSIS — Z30433 Encounter for removal and reinsertion of intrauterine contraceptive device: Secondary | ICD-10-CM

## 2020-11-22 NOTE — Progress Notes (Signed)
    Katelyn [name] [facility] [birth year] [id number]        45 y.o.  [id number]   RP: Removal and Insertion of Mirena IUD  HPI: Doing very well with Mirena IUD, time to change after 5 yrs.  No pelvic pain.  No abnormal bleeding.  No vaginal d/c.   OB History  Gravida Para Term Preterm AB Living  4 2     2 2   SAB IAB Ectopic Multiple Live Births  2            # Outcome Date GA Lbr Len/2nd Weight Sex Delivery Anes PTL Lv  4 SAB           3 SAB           2 Para           1 Para             Past medical history,surgical history, problem list, medications, allergies, family history and social history were all reviewed and documented in the EPIC chart.   Directed ROS with pertinent positives and negatives documented in the history of present illness/assessment and plan.  Exam:  Vitals:   11/22/20 1425  BP: 118/76   General appearance:  Normal                                                                    IUD procedure note       Patient presented to the office today for removal and placement of Mirena IUD. The patient had previously been provided with literature information on this method of contraception. The risks benefits and pros and cons were discussed and all her questions were answered. She is fully aware that this form of contraception is 99% effective and is good for 5 years.  Pelvic exam: Vulva normal Vagina: No lesions or discharge Cervix: No lesions or discharge.  IUD strings visible at [facility].  Easy removal of IUD by pulling on strings with a fenestrated clamp.  IUD complete and intact.  Shown to patient and discarded. Uterus: AV position Adnexa: No masses or tenderness Rectal exam: Not done  The cervix was cleansed with Betadine solution. Hurricane spray on the cervix.  An Os Finder was used to feel the curve of the cervix, gently dilate and get a hysterometry which was at 7.5 cm. The IUD was shown to the patient and inserted in a sterile fashion.  The IUD string was  trimmed. Patient was instructed to return back to the office in one month for follow up.        Assessment/Plan:  45 y.o. [id number]   1. Encounter for IUD removal and reinsertion Easy Mirena IUD removal and insertion of a new one.  Well tolerated with no complication.  Post procedure precautions reviewed.  F/U in 4 weeks for IUD check.  Princess Bruins MD, 2:39 PM 11/22/2020

## 2020-11-29 ENCOUNTER — Encounter: Payer: Self-pay | Admitting: Anesthesiology

## 2020-12-26 ENCOUNTER — Ambulatory Visit: Payer: 59 | Admitting: Obstetrics & Gynecology

## 2020-12-27 ENCOUNTER — Encounter: Payer: Self-pay | Admitting: Obstetrics & Gynecology

## 2020-12-27 ENCOUNTER — Ambulatory Visit (INDEPENDENT_AMBULATORY_CARE_PROVIDER_SITE_OTHER): Payer: 59 | Admitting: Obstetrics & Gynecology

## 2020-12-27 ENCOUNTER — Other Ambulatory Visit: Payer: Self-pay

## 2020-12-27 VITALS — BP 110/70

## 2020-12-27 DIAGNOSIS — Z30431 Encounter for routine checking of intrauterine contraceptive device: Secondary | ICD-10-CM | POA: Diagnosis not present

## 2020-12-27 NOTE — Progress Notes (Signed)
    Katelyn [name] [facility] Jul 26, 1976 [id number]        45 y.o.  [id number] Married  RP: Mirena IUD check 4 weeks post insertion  HPI: Well on new Mirena IUD x 4 weeks.  Very mild spotting at first, now resolved.  No pelvic pain.  No vaginal discharge.  No pain with IC.  No fever.   OB History  Gravida Para Term Preterm AB Living  4 2     2 2   SAB IAB Ectopic Multiple Live Births  2            # Outcome Date GA Lbr Len/2nd Weight Sex Delivery Anes PTL Lv  4 SAB           3 SAB           2 Para           1 Para             Past medical history,surgical history, problem list, medications, allergies, family history and social history were all reviewed and documented in the EPIC chart.   Directed ROS with pertinent positives and negatives documented in the history of present illness/assessment and plan.  Exam:  Vitals:   12/27/20 1423  BP: 110/70   General appearance:  Normal  Abdomen: Normal  Gynecologic exam: Vulva normal.  Speculum:  Cervix normal, no erythema.  IUD strings visible at the External Os.  No blood.  Normal secretions.   Assessment/Plan:  45 y.o. [id number]   1. Encounter for routine checking of intrauterine contraceptive device (IUD) Well with new Mirena IUD since insertion.  Mirena IUD in good position with no sign of infection.  Princess Bruins MD, 2:40 PM 12/27/2020

## 2021-07-04 ENCOUNTER — Ambulatory Visit (INDEPENDENT_AMBULATORY_CARE_PROVIDER_SITE_OTHER): Payer: 59 | Admitting: Obstetrics & Gynecology

## 2021-07-04 ENCOUNTER — Encounter: Payer: Self-pay | Admitting: Obstetrics & Gynecology

## 2021-07-04 ENCOUNTER — Other Ambulatory Visit: Payer: Self-pay

## 2021-07-04 VITALS — BP 104/68 | HR 82 | Resp 16 | Ht 66.25 in | Wt 140.0 lb

## 2021-07-04 DIAGNOSIS — Z30431 Encounter for routine checking of intrauterine contraceptive device: Secondary | ICD-10-CM | POA: Diagnosis not present

## 2021-07-04 DIAGNOSIS — Z01419 Encounter for gynecological examination (general) (routine) without abnormal findings: Secondary | ICD-10-CM

## 2021-07-04 NOTE — Progress Notes (Signed)
Buna Shawler [facility] 1975-12-26 [id number]   History:    45 y.o. [id number] Married.  Son is in 10th grade, daughter in [employer] grade.   RP:  Established patient presenting for annual gyn exam    HPI: Well on Mirena IUD x 11/2020.  Rare light periods.  No pelvic pain.  No pain with IC.  Urine/BMs wnl.  Breasts wnl.  BMI 22.76.  Good nutrition.  Active physically.  Will schedule Health labs with Fam MD.    Past medical history,surgical history, family history and social history were all reviewed and documented in the EPIC chart.  Gynecologic History No LMP recorded. (Menstrual status: IUD).  Obstetric History OB History  Gravida Para Term Preterm AB Living  '4 2     2 2  '$ SAB IAB Ectopic Multiple Live Births  2            # Outcome Date GA Lbr Len/2nd Weight Sex Delivery Anes PTL Lv  4 SAB           3 SAB           2 Para           1 Para              ROS: A ROS was performed and pertinent positives and negatives are included in the history.  GENERAL: No fevers or chills. HEENT: No change in vision, no earache, sore throat or sinus congestion. NECK: No pain or stiffness. CARDIOVASCULAR: No chest pain or pressure. No palpitations. PULMONARY: No shortness of breath, cough or wheeze. GASTROINTESTINAL: No abdominal pain, nausea, vomiting or diarrhea, melena or bright red blood per rectum. GENITOURINARY: No urinary frequency, urgency, hesitancy or dysuria. MUSCULOSKELETAL: No joint or muscle pain, no back pain, no recent trauma. DERMATOLOGIC: No rash, no itching, no lesions. ENDOCRINE: No polyuria, polydipsia, no heat or cold intolerance. No recent change in weight. HEMATOLOGICAL: No anemia or easy bruising or bleeding. NEUROLOGIC: No headache, seizures, numbness, tingling or weakness. PSYCHIATRIC: No depression, no loss of interest in normal activity or change in sleep pattern.     Exam:   BP 104/68   Pulse 82   Resp 16   Ht 5' 6.25" (1.683 m)   Wt 140 lb (63.5 kg)   BMI 22.43  kg/m   Body mass index is 22.43 kg/m.  General appearance : Well developed well nourished female. No acute distress HEENT: Eyes: no retinal hemorrhage or exudates,  Neck supple, trachea midline, no carotid bruits, no thyroidmegaly Lungs: Clear to auscultation, no rhonchi or wheezes, or rib retractions  Heart: Regular rate and rhythm, no murmurs or gallops Breast:Examined in sitting and supine position were symmetrical in appearance, no palpable masses or tenderness,  no skin retraction, no nipple inversion, no nipple discharge, no skin discoloration, no axillary or supraclavicular lymphadenopathy Abdomen: no palpable masses or tenderness, no rebound or guarding Extremities: no edema or skin discoloration or tenderness  Pelvic: Vulva: Normal             Vagina: No gross lesions or discharge  Cervix: No gross lesions or discharge.  IUD strings visible at the external os.  Uterus  AV, normal size, shape and consistency, non-tender and mobile  Adnexa  Without masses or tenderness  Anus: Normal   Assessment/Plan:  45 y.o. female for annual exam   1. Well female exam with routine gynecological exam Normal gynecologic exam.  No indication for Pap test this year, Pap test Negative August  2021, will repeat at 2 to 3 years.  Breast exam normal.  Screening mammogram negative in January 2022.  Good body mass index at 22.43.  Continue with fitness and healthy nutrition.  Health labs with family physician.  2. Encounter for routine checking of intrauterine contraceptive device (IUD) Well on new Mirena IUD since January 2022.  IUD in good position.  Other orders - tretinoin (RETIN-A) 0.025 % cream; Apply topically.   Princess Bruins MD, 2:10 PM 07/04/2021

## 2021-10-31 ENCOUNTER — Other Ambulatory Visit: Payer: Self-pay | Admitting: Obstetrics & Gynecology

## 2021-10-31 DIAGNOSIS — Z1231 Encounter for screening mammogram for malignant neoplasm of breast: Secondary | ICD-10-CM

## 2021-12-19 ENCOUNTER — Ambulatory Visit
Admission: RE | Admit: 2021-12-19 | Discharge: 2021-12-19 | Disposition: A | Discharge status: Home / Self Care | Payer: Managed Care, Other (non HMO) | Source: Ambulatory Visit | Attending: Obstetrics & Gynecology | Admitting: Obstetrics & Gynecology

## 2021-12-19 DIAGNOSIS — Z1231 Encounter for screening mammogram for malignant neoplasm of breast: Secondary | ICD-10-CM

## 2021-12-21 ENCOUNTER — Other Ambulatory Visit: Payer: Self-pay | Admitting: Obstetrics & Gynecology

## 2021-12-21 DIAGNOSIS — R928 Other abnormal and inconclusive findings on diagnostic imaging of breast: Secondary | ICD-10-CM

## 2022-01-16 ENCOUNTER — Ambulatory Visit
Admission: RE | Admit: 2022-01-16 | Discharge: 2022-01-16 | Disposition: A | Discharge status: Home / Self Care | Payer: Managed Care, Other (non HMO) | Source: Ambulatory Visit | Attending: Obstetrics & Gynecology | Admitting: Obstetrics & Gynecology

## 2022-01-16 ENCOUNTER — Ambulatory Visit: Payer: Managed Care, Other (non HMO)

## 2022-01-16 DIAGNOSIS — R928 Other abnormal and inconclusive findings on diagnostic imaging of breast: Secondary | ICD-10-CM

## 2022-03-12 IMAGING — MG DIGITAL SCREENING BILAT W/ TOMO W/ CAD
8 series · 9 of 24 positions shown · non-contrast
Comparison: Previous exam(s).

CLINICAL DATA: Screening.

EXAM:
DIGITAL SCREENING BILATERAL MAMMOGRAM WITH TOMO AND CAD

[R CC synth-2D]
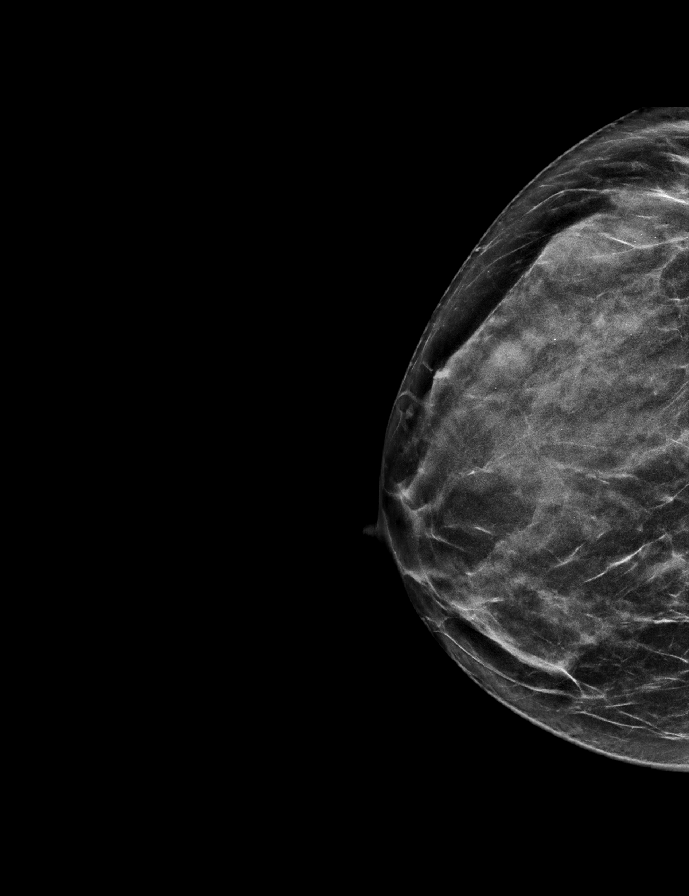

[R MLO synth-2D]
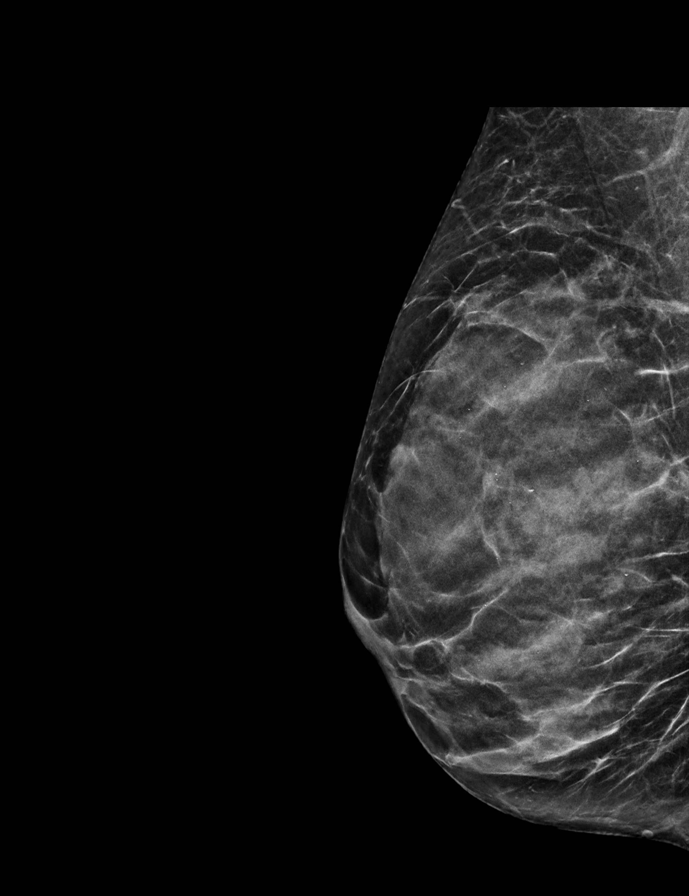

[L MLO synth-2D]
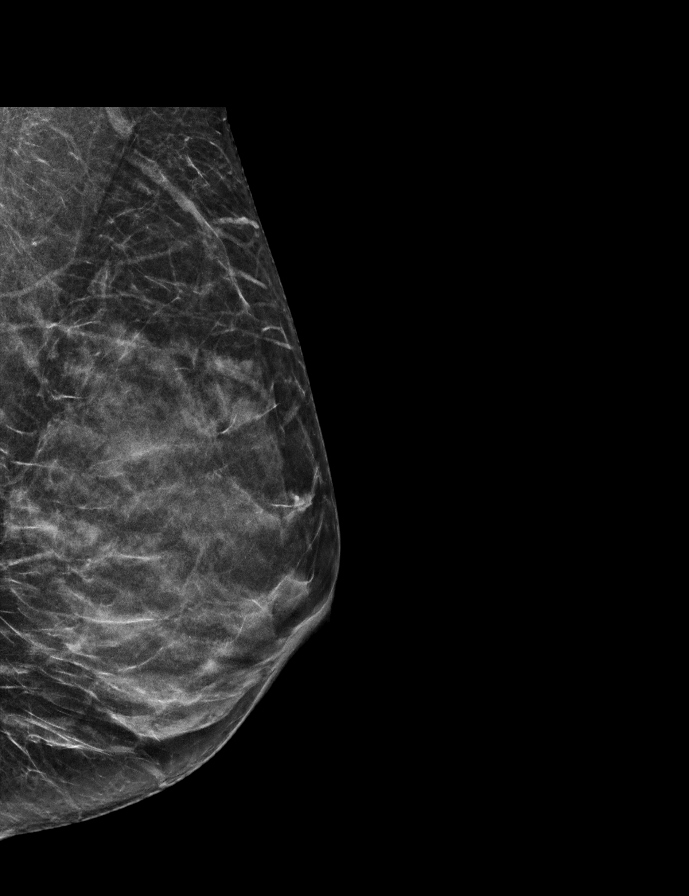

[L CC synth-2D]
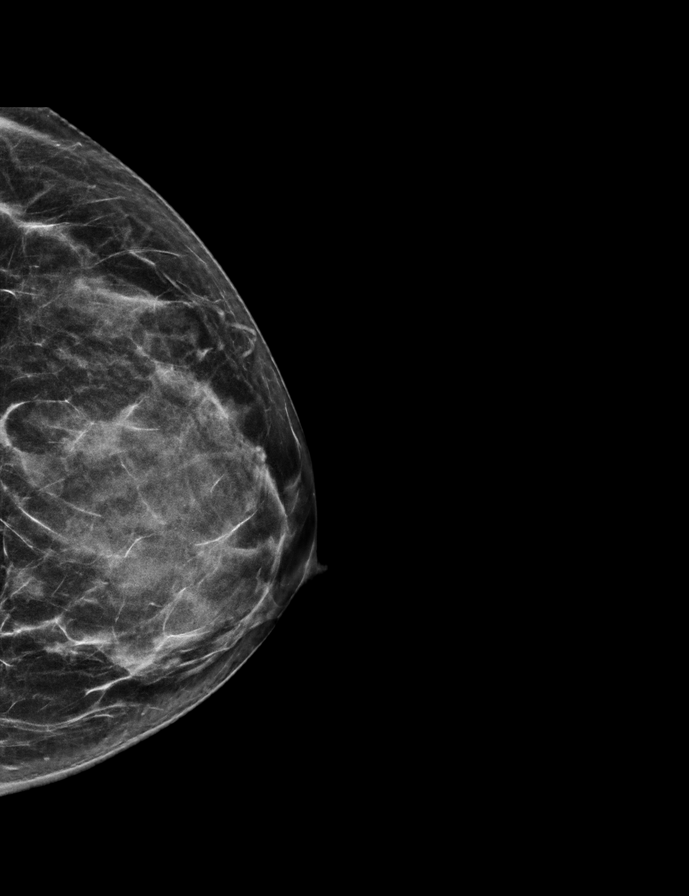

[L CC tomo · 2 of 54 frames shown]
[frame 18/54]
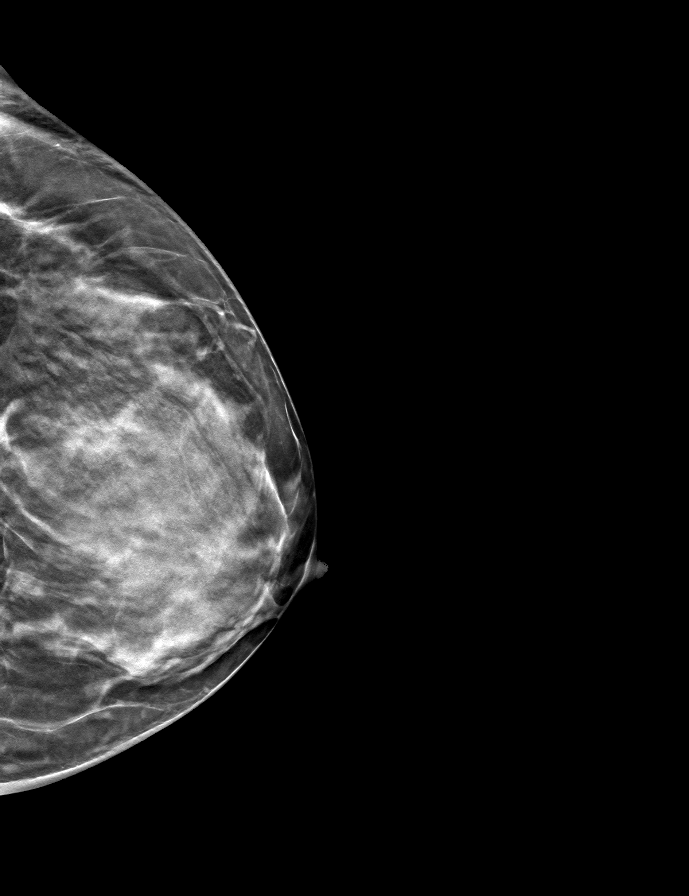
[frame 27/54]
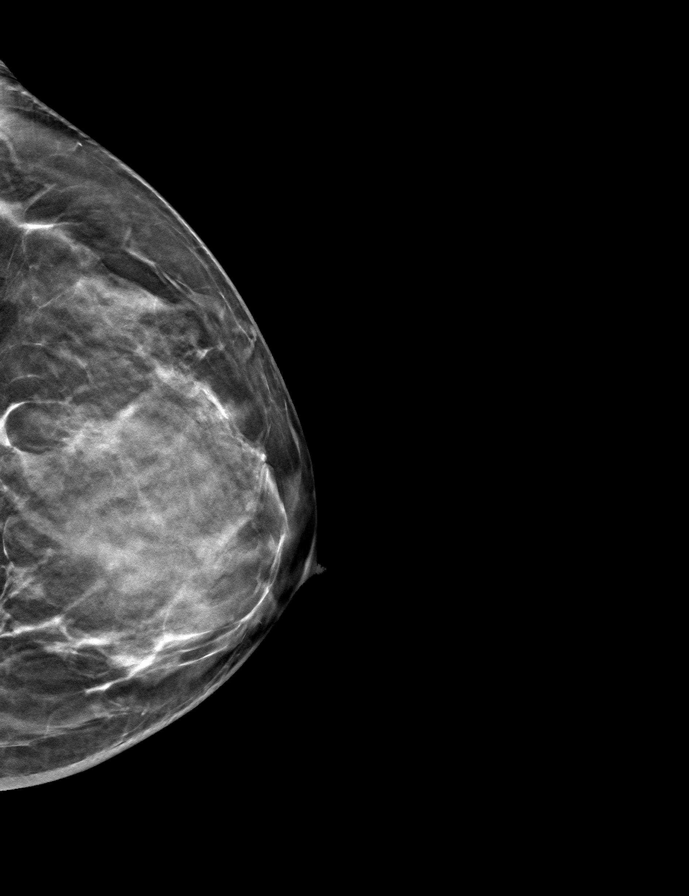

[L MLO tomo · tomo slice 27/52.0]
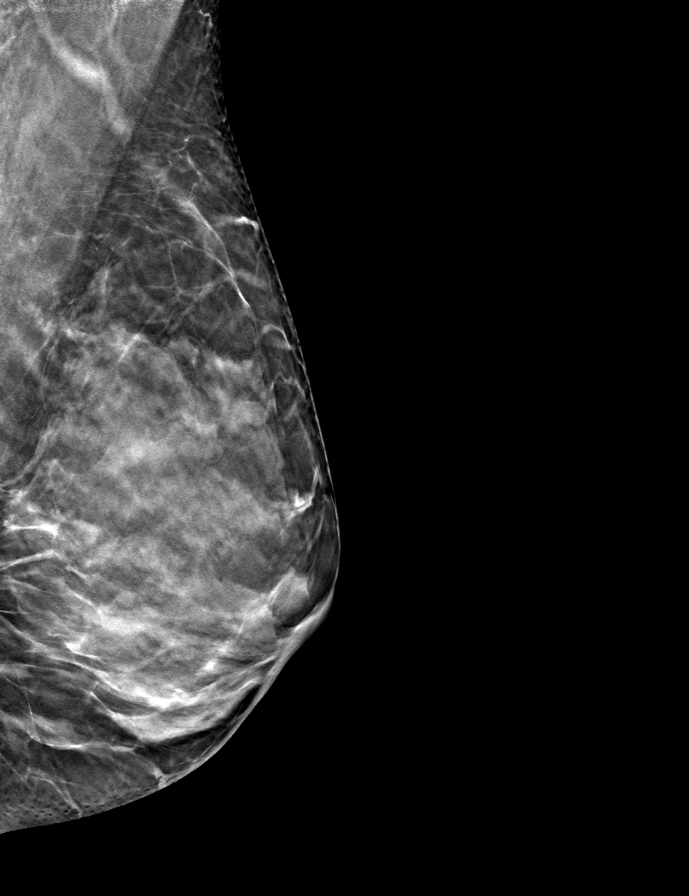

[R CC tomo · tomo slice 28/55.0]
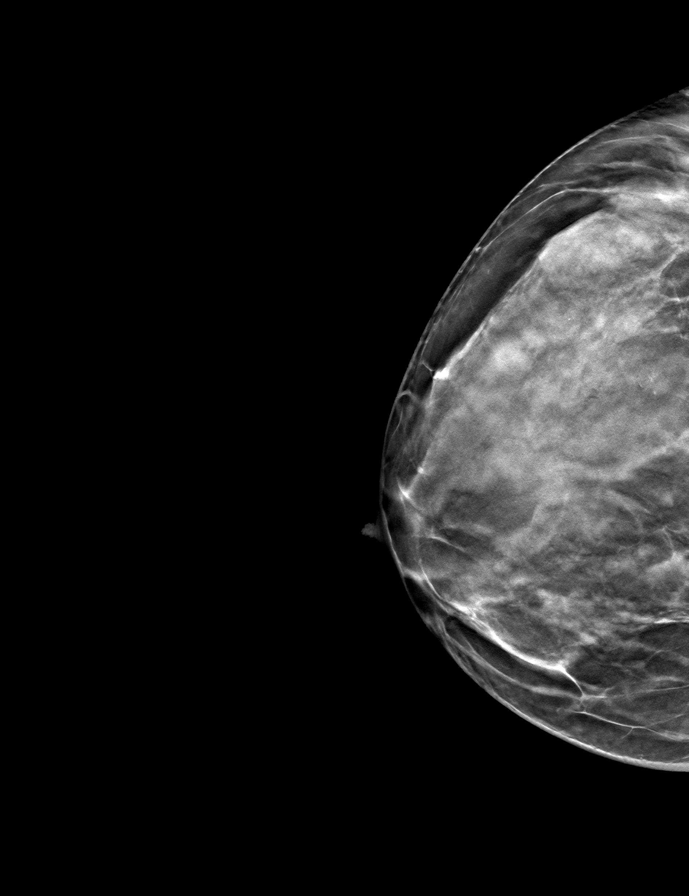

[R MLO tomo · tomo slice 27/52.0]
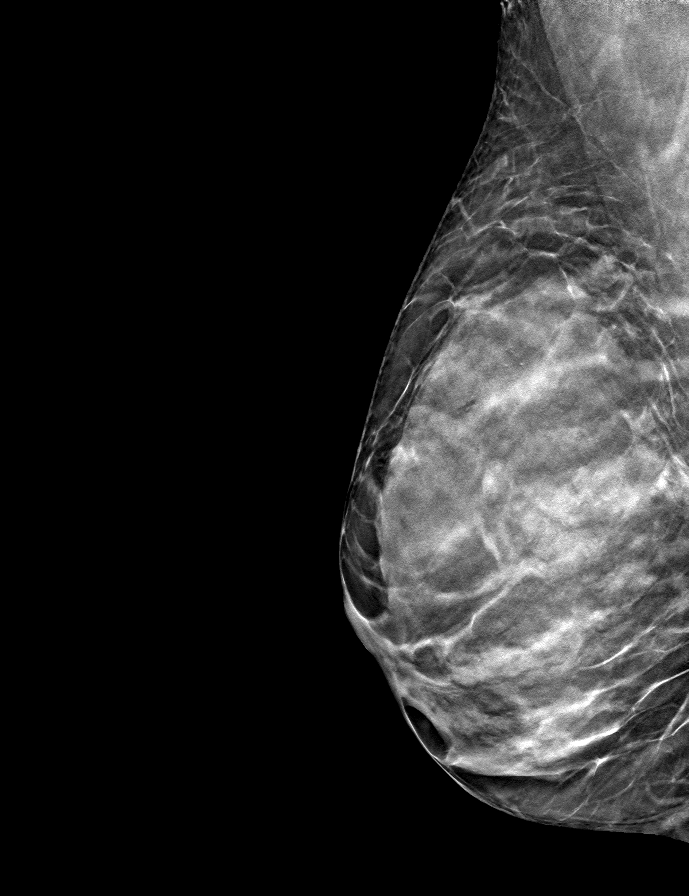

[9 of 24 positions shown; findings below may reference images not displayed]

ACR Breast Density Category d: The breast tissue is extremely dense,
which lowers the sensitivity of mammography
FINDINGS: There are no findings suspicious for malignancy. Images were
processed with CAD.
IMPRESSION: No mammographic evidence of malignancy. A result letter of this
screening mammogram will be mailed directly to the patient.

RECOMMENDATION:
Screening mammogram in one year. (Code:WO-0-ZI0)

BI-RADS CATEGORY  1: Negative.

## 2022-07-05 ENCOUNTER — Ambulatory Visit: Payer: 59 | Admitting: Obstetrics & Gynecology

## 2022-07-22 ENCOUNTER — Ambulatory Visit (INDEPENDENT_AMBULATORY_CARE_PROVIDER_SITE_OTHER): Payer: Managed Care, Other (non HMO) | Admitting: Obstetrics & Gynecology

## 2022-07-22 ENCOUNTER — Encounter: Payer: Self-pay | Admitting: Obstetrics & Gynecology

## 2022-07-22 VITALS — BP 104/62 | HR 78 | Ht 66.0 in | Wt 139.0 lb

## 2022-07-22 DIAGNOSIS — Z30431 Encounter for routine checking of intrauterine contraceptive device: Secondary | ICD-10-CM

## 2022-07-22 DIAGNOSIS — Z01419 Encounter for gynecological examination (general) (routine) without abnormal findings: Secondary | ICD-10-CM

## 2022-07-22 NOTE — Progress Notes (Signed)
Katelyn [name] [facility] Jan 25, 1976 [id number]   History:    47 y.F.[id number] Married.  Son is in 11th grade, daughter in 6th grade.   RP:  Established patient presenting for annual gyn exam    HPI: Well on Mirena IUD x 11/2020.  Rare light periods.  No pelvic pain.  No pain with IC.  Pap Neg in 06/2020.  No H/O abnormal Pap.  Repeat Pap test at 3 years.  Urine/BMs wnl.  Breasts wnl.  Mammo Rt breast Neg in 12/2021, Lt breast Dx mammo/US Neg 01/2022.  BMI 22.44.  Good nutrition.  Active physically.  Will schedule Health labs with Fam MD and Colono with Gertie Fey.   Past medical history,surgical history, family history and social history were all reviewed and documented in the EPIC chart.  Gynecologic History No LMP recorded. (Menstrual status: IUD).  Obstetric History OB History  Gravida Para Term Preterm AB Living  '4 2     2 2  '$ SAB IAB Ectopic Multiple Live Births  2            # Outcome Date GA Lbr Len/2nd Weight Sex Delivery Anes PTL Lv  4 SAB           3 SAB           2 Para           1 Para              ROS: A ROS was performed and pertinent positives and negatives are included in the history.  GENERAL: No fevers or chills. HEENT: No change in vision, no earache, sore throat or sinus congestion. NECK: No pain or stiffness. CARDIOVASCULAR: No chest pain or pressure. No palpitations. PULMONARY: No shortness of breath, cough or wheeze. GASTROINTESTINAL: No abdominal pain, nausea, vomiting or diarrhea, melena or bright red blood per rectum. GENITOURINARY: No urinary frequency, urgency, hesitancy or dysuria. MUSCULOSKELETAL: No joint or muscle pain, no back pain, no recent trauma. DERMATOLOGIC: No rash, no itching, no lesions. ENDOCRINE: No polyuria, polydipsia, no heat or cold intolerance. No recent change in weight. HEMATOLOGICAL: No anemia or easy bruising or bleeding. NEUROLOGIC: No headache, seizures, numbness, tingling or weakness. PSYCHIATRIC: No depression, no loss of interest in  normal activity or change in sleep pattern.    Exam:   BP 104/62   Pulse 78   Ht '5\' 6"'$  (1.676 m)   Wt 139 lb (63 kg)   SpO2 100%   BMI 22.44 kg/m   Body mass index is 22.44 kg/m.  General appearance : Well developed well nourished female. No acute distress HEENT: Eyes: no retinal hemorrhage or exudates,  Neck supple, trachea midline, no carotid bruits, no thyroidmegaly Lungs: Clear to auscultation, no rhonchi or wheezes, or rib retractions  Heart: Regular rate and rhythm, no murmurs or gallops Breast:Examined in sitting and supine position were symmetrical in appearance, no palpable masses or tenderness,  no skin retraction, no nipple inversion, no nipple discharge, no skin discoloration, no axillary or supraclavicular lymphadenopathy Abdomen: no palpable masses or tenderness, no rebound or guarding Extremities: no edema or skin discoloration or tenderness  Pelvic: Vulva: Normal             Vagina: No gross lesions or discharge  Cervix: No gross lesions or discharge.  IUD strings felt on bimanual exam.  Uterus  AV, normal size, shape and consistency, non-tender and mobile  Adnexa  Without masses or tenderness  Anus: Normal   Assessment/Plan:  45  y.o. female for annual exam   1. Well female exam with routine gynecological exam Well on Mirena IUD x 11/2020.  Rare light periods.  No pelvic pain.  No pain with IC.  Pap Neg in 06/2020.  No H/O abnormal Pap.  Repeat Pap test at 3 years.  Urine/BMs wnl.  Breasts wnl.  Mammo Rt breast Neg in 12/2021, Lt breast Dx mammo/US Neg 01/2022.  BMI 22.44.  Good nutrition.  Active physically.  Will schedule Health labs with Fam MD and Colono with Gertie Fey.  2. Encounter for routine checking of intrauterine contraceptive device (IUD)  Well with Mirena IUD x 11/2020.  Mirena IUD in good position.  Princess Bruins MD, 10:15 AM 07/22/2022

## 2022-11-08 ENCOUNTER — Other Ambulatory Visit: Payer: Self-pay | Admitting: Obstetrics & Gynecology

## 2022-11-08 DIAGNOSIS — Z1231 Encounter for screening mammogram for malignant neoplasm of breast: Secondary | ICD-10-CM

## 2023-01-22 DIAGNOSIS — Z1231 Encounter for screening mammogram for malignant neoplasm of breast: Secondary | ICD-10-CM

## 2023-03-04 ENCOUNTER — Ambulatory Visit
Admission: RE | Admit: 2023-03-04 | Discharge: 2023-03-04 | Disposition: A | Discharge status: Home / Self Care | Payer: Managed Care, Other (non HMO) | Source: Ambulatory Visit | Attending: Obstetrics & Gynecology | Admitting: Obstetrics & Gynecology

## 2023-03-04 DIAGNOSIS — Z1231 Encounter for screening mammogram for malignant neoplasm of breast: Secondary | ICD-10-CM

## 2024-08-26 NOTE — Progress Notes (Signed)
 48 y.o. [id number] female here with Mirena  IUD x 11/2020 (short string documented at 2022-2023 exam, only felt in 2023), anxiety and depression for annual exam. Married. PCP: Nanci Senior, MD   No LMP recorded. (Menstrual status: IUD).   She reports no other concerns. Urine sample provided: No  Abnormal bleeding: none Pelvic discharge or pain: none Breast mass, nipple discharge or skin changes : none  Sexually active: Yes Birth control: IUD Last PAP: No results found for: DIAGPAP, HPVHIGH, ADEQPAP Last mammogram: 03/04/23 density d, birads 1 neg Last colonoscopy: colorguard a year ago per pt   Exercising: Not currently Smoker: No  Flowsheet Row Office Visit from 08/30/2024 in [facility]  PHQ-2 Total Score 2    PHQ9: 10, +passive SI (feels useless on days when mood symptoms are overwhelming)   GYN HISTORY: No sig hx  OB History  Gravida Para Term Preterm AB Living  4 2 2  2 2   SAB IAB Ectopic Multiple Live Births  2    2    # Outcome Date GA Lbr Len/2nd Weight Sex Type Anes PTL Lv  4 SAB           3 SAB           2 Term      CS-Unspec   LIV  1 Term      CS-Unspec   LIV   History reviewed. No pertinent past medical history. Past Surgical History:  Procedure Laterality Date   CESAREAN SECTION     x2   INTRAUTERINE DEVICE (IUD) INSERTION     mirena  inserted 11-22-20   TONSILLECTOMY AND ADENOIDECTOMY     Current Outpatient Medications on File Prior to Visit  Medication Sig Dispense Refill   buPROPion (WELLBUTRIN XL) 300 MG 24 hr tablet Take 300 mg by mouth daily.     Multiple Vitamin (MULTIVITAMIN ADULT) TABS Orally     sertraline (ZOLOFT) 100 MG tablet Take 100 mg by mouth daily.     tretinoin (RETIN-A) 0.025 % cream Apply topically.     No current facility-administered medications on file prior to visit.   Social History   Socioeconomic History   Marital status: Married    Spouse name: Not on file   Number of  children: Not on file   Years of education: Not on file   Highest education level: Not on file  Occupational History   Not on file  Tobacco Use   Smoking status: Never   Smokeless tobacco: Never  Vaping Use   Vaping status: Never Used  Substance and Sexual Activity   Alcohol use: Yes    Alcohol/week: 0.0 standard drinks of alcohol    Comment: 2 glasses a week    Drug use: Never   Sexual activity: Yes    Partners: Male    Birth control/protection: I.U.D.    Comment: 1st intercourse- 20, partners- 1, married- 19 yrs   Other Topics Concern   Not on file  Social History Narrative   Not on file   Social Drivers of Health   Financial Resource Strain: Not on file  Food Insecurity: Not on file  Transportation Needs: Not on file  Physical Activity: Not on file  Stress: Not on file  Social Connections: Not on file  Intimate Partner Violence: Not on file   Family History  Problem Relation Age of Onset   Breast cancer Paternal Aunt 75   Cancer Maternal Grandmother  pancreatic   Cancer Paternal Grandfather        pancreatic   Allergies  Allergen Reactions   Tetanus Toxoid, Adsorbed     Other Reaction(s): fever     PE Today's Vitals   08/30/24 1530  BP: 94/62  Pulse: 76  Temp: 98.7 F (37.1 C)  TempSrc: Oral  SpO2: 99%  Weight: 144 lb (65.3 kg)  Height: 5' 6 (1.676 m)   Body mass index is 23.24 kg/m.  Physical Exam Vitals reviewed. Exam conducted with a chaperone present.  Constitutional:      General: She is not in acute distress.    Appearance: Normal appearance.  HENT:     Head: Normocephalic and atraumatic.     Nose: Nose normal.  Eyes:     Extraocular Movements: Extraocular movements intact.     Conjunctiva/sclera: Conjunctivae normal.  Neck:     Thyroid : No thyroid  mass, thyromegaly or thyroid  tenderness.  Pulmonary:     Effort: Pulmonary effort is normal.  Chest:     Chest wall: No mass or tenderness.  Breasts:    Right: Normal. No  swelling, mass, nipple discharge, skin change or tenderness.     Left: Normal. No swelling, mass, nipple discharge, skin change or tenderness.  Abdominal:     General: There is no distension.     Palpations: Abdomen is soft.     Tenderness: There is no abdominal tenderness.  Genitourinary:    General: Normal vulva.     Exam position: Lithotomy position.     Urethra: No prolapse.     Vagina: Normal. No vaginal discharge or bleeding.     Cervix: Normal. No lesion.     Uterus: Normal. Not enlarged and not tender.      Adnexa: Right adnexa normal and left adnexa normal.     Comments: IUD strings, not seen or felt (only felt on 2023 exam) Musculoskeletal:        General: Normal range of motion.     Cervical back: Normal range of motion.  Lymphadenopathy:     Upper Body:     Right upper body: No axillary adenopathy.     Left upper body: No axillary adenopathy.     Lower Body: No right inguinal adenopathy. No left inguinal adenopathy.  Skin:    General: Skin is warm and dry.  Neurological:     General: No focal deficit present.     Mental Status: She is alert.  Psychiatric:        Mood and Affect: Mood normal.        Behavior: Behavior normal.      Assessment and Plan:        Well woman exam with routine gynecological exam Assessment & Plan: Cervical cancer screening performed according to ASCCP guidelines. Encouraged annual mammogram screening- DUE! Cologuard completed last year Labs and immunizations with her primary Encouraged safe sexual practices as indicated Encouraged healthy lifestyle practices with diet and exercise For patients under 50yo, I recommend 1000mg  calcium daily and 600IU of vitamin D daily.    Cervical cancer screening -     Cytology - PAP  Intrauterine contraceptive device threads lost, initial encounter Assessment & Plan: Sure IUD strings documented on 2022 exam 2023 exam notes IUD strings were only located at external os Strings were not  visualized this year No indication for imaging given documentation of shortness strings in the past However I would recommend imaging if bleeding becomes irregular.    Uses hormone  releasing intrauterine device (IUD) for contraception  Positive depression screening  Perimenopause Assessment & Plan: Perimenopause with mood sx On Wellbutrin and Zoloft managed by PCP Recommend starting low-dose estradiol however patient is due for mammogram 2024 mammogram with dense breast Gail breast cancer screening completed today: 1.3, 12.3%, 5-year and lifetime risk respectively Overall her breast cancer risk No hypertension, diabetes, hyperlipidemia, tobacco use She is an appropriate candidate for estrogen use, however she will need to update her mammogram prior to starting the estradiol patch Plan for 25 mcg patch once mammogram screening has been received. Risk reviewed including DVT, breast cancer, uterine cancer, cardiovascular risk.  Side effects also reviewed.  Return office in 3 months.    Vera LULLA Pa, MD

## 2024-08-30 ENCOUNTER — Encounter: Payer: Self-pay | Admitting: Obstetrics and Gynecology

## 2024-08-30 ENCOUNTER — Ambulatory Visit (INDEPENDENT_AMBULATORY_CARE_PROVIDER_SITE_OTHER): Admitting: Obstetrics and Gynecology

## 2024-08-30 ENCOUNTER — Other Ambulatory Visit (HOSPITAL_COMMUNITY)
Admission: RE | Admit: 2024-08-30 | Discharge: 2024-08-30 | Disposition: A | Discharge status: Home / Self Care | Source: Ambulatory Visit | Attending: Obstetrics and Gynecology | Admitting: Obstetrics and Gynecology

## 2024-08-30 VITALS — BP 94/62 | HR 76 | Temp 98.7°F | Ht 66.0 in | Wt 144.0 lb

## 2024-08-30 DIAGNOSIS — F329 Major depressive disorder, single episode, unspecified: Secondary | ICD-10-CM | POA: Insufficient documentation

## 2024-08-30 DIAGNOSIS — Z975 Presence of (intrauterine) contraceptive device: Secondary | ICD-10-CM | POA: Insufficient documentation

## 2024-08-30 DIAGNOSIS — T8332XA Displacement of intrauterine contraceptive device, initial encounter: Secondary | ICD-10-CM | POA: Insufficient documentation

## 2024-08-30 DIAGNOSIS — Z01419 Encounter for gynecological examination (general) (routine) without abnormal findings: Secondary | ICD-10-CM | POA: Insufficient documentation

## 2024-08-30 DIAGNOSIS — Z124 Encounter for screening for malignant neoplasm of cervix: Secondary | ICD-10-CM | POA: Insufficient documentation

## 2024-08-30 DIAGNOSIS — Z1331 Encounter for screening for depression: Secondary | ICD-10-CM | POA: Diagnosis not present

## 2024-08-30 DIAGNOSIS — F419 Anxiety disorder, unspecified: Secondary | ICD-10-CM | POA: Insufficient documentation

## 2024-08-30 DIAGNOSIS — N951 Menopausal and female climacteric states: Secondary | ICD-10-CM | POA: Insufficient documentation

## 2024-08-30 NOTE — Assessment & Plan Note (Addendum)
 Perimenopause with mood sx On Wellbutrin and Zoloft managed by PCP Recommend starting low-dose estradiol however patient is due for mammogram 2024 mammogram with dense breast Gail breast cancer screening completed today: 1.3, 12.3%, 5-year and lifetime risk respectively Overall her breast cancer risk No hypertension, diabetes, hyperlipidemia, tobacco use She is an appropriate candidate for estrogen use, however she will need to update her mammogram prior to starting the estradiol patch Plan for 25 mcg patch once mammogram screening has been received. Risk reviewed including DVT, breast cancer, uterine cancer, cardiovascular risk.  Side effects also reviewed.  Return office in 3 months.

## 2024-08-30 NOTE — Assessment & Plan Note (Addendum)
 Sure IUD strings documented on 2022 exam 2023 exam notes IUD strings were only located at external os Strings were not visualized this year No indication for imaging given documentation of shortness strings in the past However I would recommend imaging if bleeding becomes irregular.

## 2024-08-30 NOTE — Patient Instructions (Signed)

## 2024-08-30 NOTE — Assessment & Plan Note (Signed)
 Cervical cancer screening performed according to [facility] guidelines. Encouraged annual mammogram screening- DUE! Cologuard completed last year Labs and immunizations with her primary Encouraged safe sexual practices as indicated Encouraged healthy lifestyle practices with diet and exercise For patients under 50yo, I recommend 1000mg  calcium daily and 600IU of vitamin D daily.

## 2024-08-31 ENCOUNTER — Other Ambulatory Visit: Payer: Self-pay | Admitting: Obstetrics and Gynecology

## 2024-08-31 DIAGNOSIS — Z1231 Encounter for screening mammogram for malignant neoplasm of breast: Secondary | ICD-10-CM

## 2024-09-02 ENCOUNTER — Ambulatory Visit: Payer: Self-pay | Admitting: Obstetrics and Gynecology

## 2024-09-02 LAB — CYTOLOGY - PAP
Comment: NEGATIVE
Diagnosis: NEGATIVE
High risk HPV: NEGATIVE

## 2024-09-03 ENCOUNTER — Ambulatory Visit
Admission: RE | Admit: 2024-09-03 | Discharge: 2024-09-03 | Disposition: A | Discharge status: Home / Self Care | Source: Ambulatory Visit

## 2024-09-03 DIAGNOSIS — Z1231 Encounter for screening mammogram for malignant neoplasm of breast: Secondary | ICD-10-CM

## 2024-09-14 ENCOUNTER — Encounter: Payer: Self-pay | Admitting: Obstetrics and Gynecology

## 2024-09-14 DIAGNOSIS — N951 Menopausal and female climacteric states: Secondary | ICD-10-CM

## 2024-09-14 MED ORDER — ESTRADIOL 0.025 MG/24HR TD PTTW: 1.0000 | MEDICATED_PATCH | TRANSDERMAL | 4 refills | Status: DC

## 2024-11-30 ENCOUNTER — Encounter: Payer: Self-pay | Admitting: Obstetrics and Gynecology

## 2024-11-30 ENCOUNTER — Ambulatory Visit: Admitting: Obstetrics and Gynecology

## 2024-11-30 VITALS — BP 94/60 | HR 76 | Temp 98.3°F | Wt 144.0 lb

## 2024-11-30 DIAGNOSIS — F321 Major depressive disorder, single episode, moderate: Secondary | ICD-10-CM

## 2024-11-30 DIAGNOSIS — N951 Menopausal and female climacteric states: Secondary | ICD-10-CM

## 2024-11-30 MED ORDER — ESTRADIOL 0.05 MG/24HR TD PTTW: 1.0000 | MEDICATED_PATCH | TRANSDERMAL | 3 refills | Status: AC

## 2024-11-30 NOTE — Assessment & Plan Note (Addendum)
 Perimenopause with mood sx On Wellbutrin and Zoloft managed by PCP Mild changes in mood symptoms with 25mcg patch, will increase to mid dosing patch at 50mcg Reviewed that we can increase up to 100mcg if needed RTO for annual exam or sooner if needed.

## 2024-11-30 NOTE — Assessment & Plan Note (Signed)
 Has completed counseling before, but this was in [place] Recommend counseling given persistent symptoms despite antidepressant therapy

## 2024-11-30 NOTE — Progress Notes (Signed)
 "  49 y.o. [id number] female with Mirena  IUD x 11/2020 (short string documented at 2022-2023 exam, only felt in 2023), anxiety and depression, on HRT here for med f/u. Married.  No LMP recorded. (Menstrual status: IUD).   08/30/24: PHQ9: 10, +passive SI (feels useless on days when mood symptoms are overwhelming)  She is on Zoloft and Wellbutrin managed by her PCP. She was started on the estradiol  patch to help with perimenopausal mood symptoms  She reports doing well on the patch. She feels periods of darkness don't last as long on the patch. She notices that mood symptoms are often associated with spotting/cycle (with Mirena ). No side effects since starting patch.  09/08/24: BIRADS 1, density d  GYN HISTORY: No significant history  OB History  Gravida Para Term Preterm AB Living  4 2 2  2 2   SAB IAB Ectopic Multiple Live Births  2    2    # Outcome Date GA Lbr Len/2nd Weight Sex Type Anes PTL Lv  4 SAB           3 SAB           2 Term      CS-Unspec   LIV  1 Term      CS-Unspec   LIV   History reviewed. No pertinent past medical history. Past Surgical History:  Procedure Laterality Date   CESAREAN SECTION     x2   INTRAUTERINE DEVICE (IUD) INSERTION     mirena  inserted 11-22-20   TONSILLECTOMY AND ADENOIDECTOMY     Medications Ordered Prior to Encounter[1] Allergies[2]    PE Today's Vitals   11/30/24 1429  BP: 94/60  Pulse: 76  Temp: 98.3 F (36.8 C)  TempSrc: Oral  SpO2: 97%  Weight: 144 lb (65.3 kg)   Body mass index is 23.24 kg/m.  Physical Exam Vitals reviewed.  Constitutional:      General: She is not in acute distress.    Appearance: Normal appearance.  HENT:     Head: Normocephalic and atraumatic.     Nose: Nose normal.  Eyes:     Extraocular Movements: Extraocular movements intact.     Conjunctiva/sclera: Conjunctivae normal.  Pulmonary:     Effort: Pulmonary effort is normal.  Musculoskeletal:        General: Normal range of motion.      Cervical back: Normal range of motion.  Neurological:     General: No focal deficit present.     Mental Status: She is alert.  Psychiatric:        Mood and Affect: Mood normal.        Behavior: Behavior normal.       Assessment and Plan:        Perimenopause Assessment & Plan: Perimenopause with mood sx On Wellbutrin and Zoloft managed by PCP Mild changes in mood symptoms with 25mcg patch, will increase to mid dosing patch at 50mcg Reviewed that we can increase up to 100mcg if needed RTO for annual exam or sooner if needed.  Orders: -     Estradiol ; Place 1 patch (0.05 mg total) onto the skin 2 (two) times a week.  Dispense: 24 patch; Refill: 3  Current moderate episode of major depressive disorder without prior episode [facility]) Assessment & Plan: Has completed counseling before, but this was in [place] Recommend counseling given persistent symptoms despite antidepressant therapy  Orders: -     Ambulatory referral to Behavioral Health    Vera LULLA Pa, MD     [  1]  Current Outpatient Medications on File Prior to Visit  Medication Sig Dispense Refill   buPROPion (WELLBUTRIN XL) 300 MG 24 hr tablet Take 300 mg by mouth daily.     Multiple Vitamin (MULTIVITAMIN ADULT) TABS Orally     sertraline (ZOLOFT) 100 MG tablet Take 100 mg by mouth daily.     tretinoin (RETIN-A) 0.025 % cream Apply topically.     No current facility-administered medications on file prior to visit.  [2]  Allergies Allergen Reactions   Tetanus Toxoid, Adsorbed     Other Reaction(s): fever   "

## 2025-08-31 ENCOUNTER — Ambulatory Visit: Admitting: Obstetrics and Gynecology
